# Patient Record
Sex: Male | Born: 1955 | Race: White | Hispanic: No | Smoking: Current every day smoker
Health system: Southern US, Community
[De-identification: ages and names within clinical notes are randomized; demographics above are authoritative.]

## PROBLEM LIST (undated history)

## (undated) DIAGNOSIS — E119 Type 2 diabetes mellitus without complications: Secondary | ICD-10-CM

## (undated) DIAGNOSIS — K769 Liver disease, unspecified: Secondary | ICD-10-CM

## (undated) DIAGNOSIS — B1920 Unspecified viral hepatitis C without hepatic coma: Secondary | ICD-10-CM

## (undated) DIAGNOSIS — D496 Neoplasm of unspecified behavior of brain: Secondary | ICD-10-CM

## (undated) HISTORY — DX: Type 2 diabetes mellitus without complications: E11.9

## (undated) HISTORY — DX: Neoplasm of unspecified behavior of brain: D49.6

## (undated) HISTORY — DX: Unspecified viral hepatitis C without hepatic coma: B19.20

---

## 2014-09-28 ENCOUNTER — Other Ambulatory Visit: Payer: Self-pay

## 2014-09-28 ENCOUNTER — Emergency Department: Payer: Self-pay

## 2014-09-28 ENCOUNTER — Emergency Department
Admission: EM | Admit: 2014-09-28 | Discharge: 2014-09-28 | Disposition: A | Discharge status: Home / Self Care | Payer: Self-pay | Attending: Emergency Medicine | Admitting: Emergency Medicine

## 2014-09-28 DIAGNOSIS — E119 Type 2 diabetes mellitus without complications: Secondary | ICD-10-CM | POA: Insufficient documentation

## 2014-09-28 DIAGNOSIS — F17210 Nicotine dependence, cigarettes, uncomplicated: Secondary | ICD-10-CM | POA: Insufficient documentation

## 2014-09-28 DIAGNOSIS — R51 Headache: Secondary | ICD-10-CM

## 2014-09-28 DIAGNOSIS — G8929 Other chronic pain: Secondary | ICD-10-CM

## 2014-09-28 DIAGNOSIS — J329 Chronic sinusitis, unspecified: Secondary | ICD-10-CM | POA: Insufficient documentation

## 2014-09-28 DIAGNOSIS — R55 Syncope and collapse: Secondary | ICD-10-CM | POA: Insufficient documentation

## 2014-09-28 LAB — COMPREHENSIVE METABOLIC PANEL
ALT: 21 U/L (ref 0–55)
AST (SGOT): 26 U/L (ref 5–34)
Albumin/Globulin Ratio: 1 (ref 0.9–2.2)
Albumin: 3.6 g/dL (ref 3.5–5.0)
Alkaline Phosphatase: 53 U/L (ref 38–106)
BUN: 22 mg/dL (ref 9.0–28.0)
Bilirubin, Total: 0.9 mg/dL (ref 0.2–1.2)
CO2: 21 mEq/L — ABNORMAL LOW (ref 22–29)
Calcium: 8.5 mg/dL (ref 8.5–10.5)
Chloride: 102 mEq/L (ref 100–111)
Creatinine: 1.1 mg/dL (ref 0.7–1.3)
Globulin: 3.6 g/dL (ref 2.0–3.6)
Glucose: 178 mg/dL — ABNORMAL HIGH (ref 70–100)
Potassium: 4.6 mEq/L (ref 3.5–5.1)
Protein, Total: 7.2 g/dL (ref 6.0–8.3)
Sodium: 132 mEq/L — ABNORMAL LOW (ref 136–145)

## 2014-09-28 LAB — CBC AND DIFFERENTIAL
Basophils Absolute Automated: 0.03 10*3/uL (ref 0.00–0.20)
Basophils Automated: 0 %
Eosinophils Absolute Automated: 0.06 10*3/uL (ref 0.00–0.70)
Eosinophils Automated: 1 %
Hematocrit: 40.8 % — ABNORMAL LOW (ref 42.0–52.0)
Hgb: 14.7 g/dL (ref 13.0–17.0)
Immature Granulocytes Absolute: 0.02 10*3/uL
Immature Granulocytes: 0 %
Lymphocytes Absolute Automated: 2 10*3/uL (ref 0.50–4.40)
Lymphocytes Automated: 28 %
MCH: 30.8 pg (ref 28.0–32.0)
MCHC: 36 g/dL (ref 32.0–36.0)
MCV: 85.4 fL (ref 80.0–100.0)
MPV: 10.2 fL (ref 9.4–12.3)
Monocytes Absolute Automated: 0.63 10*3/uL (ref 0.00–1.20)
Monocytes: 9 %
Neutrophils Absolute: 4.49 10*3/uL (ref 1.80–8.10)
Neutrophils: 62 %
Nucleated RBC: 0 /100 WBC (ref 0–1)
Platelets: 153 10*3/uL (ref 140–400)
RBC: 4.78 10*6/uL (ref 4.70–6.00)
RDW: 13 % (ref 12–15)
WBC: 7.23 10*3/uL (ref 3.50–10.80)

## 2014-09-28 LAB — GFR: EGFR: >60

## 2014-09-28 MED ORDER — KETOROLAC TROMETHAMINE 30 MG/ML IJ SOLN
30.0000 mg | Freq: Once | INTRAMUSCULAR | Status: AC
Start: 2014-09-28 — End: 2014-09-28
  Administered 2014-09-28: 30 mg via INTRAVENOUS
  Filled 2014-09-28: qty 1

## 2014-09-28 MED ORDER — GABAPENTIN 400 MG PO CAPS
800.0000 mg | ORAL_CAPSULE | Freq: Every day | ORAL | 0 refills | Status: AC
Start: 2014-09-28 — End: 2014-10-28
  Filled 2014-09-28: qty 60, 30d supply, fill #0

## 2014-09-28 MED ORDER — IBUPROFEN 600 MG PO TABS
600.0000 mg | ORAL_TABLET | Freq: Four times a day (QID) | ORAL | 0 refills | Status: AC | PRN
Start: 2014-09-28 — End: ?
  Filled 2014-09-28: qty 30, 8d supply, fill #0

## 2014-09-28 MED ORDER — SODIUM CHLORIDE 0.9 % IV BOLUS
1000.0000 mL | Freq: Once | INTRAVENOUS | Status: AC
Start: 2014-09-28 — End: 2014-09-28
  Administered 2014-09-28: 1000 mL via INTRAVENOUS

## 2014-09-28 NOTE — Discharge Instructions (Signed)
Please followup with your Primary Care doctor--he is aware you were here and is expecting you to come  Please take ibuprofen for your headaches and drink plenty of fluid  Return with any concerns    Headache    You have been treated for a headache.    Headaches are very common. Most of the time they are benign (not harmful). Some headaches can be very serious. Your headache appears to be benign. The doctor feels it is OK for you to go home.    If you continue to have headaches, or if this headache does not resolve over the next few days, you should be evaluated by your regular doctor or a neurologist. Keep a "headache diary." This may help your doctor learn the cause of your headaches.    Take your headache medication as directed. This is especially important if your doctor has placed you on a daily medication to prevent headaches.    YOU SHOULD SEEK MEDICAL ATTENTION IMMEDIATELY, EITHER HERE OR AT THE NEAREST EMERGENCY DEPARTMENT, IF ANY OF THE FOLLOWING OCCURS:   Your headache gets worse.   You have a severe headache that occurs suddenly.   Your head pain is different from your normal headache.   You have a fever (temperature higher than 100.32F / 38C), especially with a stiff neck.   You feel numbness, tingling, or weakness in your arms or legs.   You pass out.   You have problems with your vision.   You vomit and have trouble taking medication or keeping it down.

## 2014-09-28 NOTE — ED Provider Notes (Signed)
[facility] EMERGENCY DEPARTMENT H&P                                             ATTENDING NOTE         CLINICAL SUMMARY          Diagnosis:    .     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 Pt well appr here. CT head negative. EKG and labs wnl. 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 Agrees with no admission at this time.      Disposition:         Discharge               Discharge Prescriptions     Medication Sig Dispense Auth. Provider    gabapentin (NEURONTIN) 400 MG capsule Take 2 capsules (800 mg total) by mouth daily. 60 capsule Sherwood Gambler, MD    ibuprofen (ADVIL,MOTRIN) 600 MG tablet Take 1 tablet (600 mg total) by mouth every 6 (six) hours as needed for Pain or Fever. 30 tablet Sherwood Gambler, MD                    CLINICAL INFORMATION        HPI:      Chief Complaint: Headache and Dizziness  .    George Dennis is a 59 y.o. male with PMH hep C and DM reporting to ED for HA x 3 days associated with multiple episodes of vasovagal syncope. Syncopal episodes occur mainly when bending over (bending over in shower, reaching for objects in low shelves, reaching for dropped objects).    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 Was started on treatment plan for vertigo but pt has not seen his PMD for over a year.    Pt endorses temporary relief from Ultram. Reports to ED for three days of worsening HA despite taking Ultram. Also endorses weakness in b/l hands.    History obtained  from: patient, pt's PMD      ROS:      Positive and negative ROS elements as per HPI.  All other systems reviewed and negative.      Physical Exam:      Pulse (!) 118  BP 112/68 mmHg  Resp 18  SpO2 95 %  Temp 98.1 F (36.7 C)    General: Well appearing, A&Ox3. Comfortable, NAD. Non toxic appearing.  Head: Atraumatic, normocephalic.  Eyes: No conjunctival injection. No discharge.  EOMI.  ENT:  Mucous membranes moist.  OP clear  Neck:Supple. Normal range of motion.   Respiratory/Chest:  No respiratory distress.  Lungs clear  Abd: soft, non tender  Neurological: Grossly symmetric strength and sensation in bilateral arms and legs.   CN II-XII grossly intact.  Speech normal, no facial  asymmetry.  Gait normal  Skin: Warm and dry. No rash.  Psychiatric: Normal affect.  Normal insight.               PAST HISTORY        Primary Care Provider: [name], MD        PMH/PSH:    .     Past Medical History   Diagnosis Date   . Diabetes mellitus    . Brain tumor    . Hepatitis C        He has past surgical history that includes Testicle surgery.      Social/Family History:      He reports that he has been smoking Cigarettes.  He has been smoking about 1.00 pack per day. He does not have any smokeless tobacco history on file. He reports that he drinks alcohol. His drug history is not on file.    History reviewed. No pertinent family history.      Listed Medications on Arrival:    .     Discharge Medication List as of 09/28/2014  1:16 PM      CONTINUE these medications which have NOT CHANGED    Details   gabapentin (NEURONTIN) 800 MG tablet Take 800 mg by mouth 3 (three) times daily., Until Discontinued, Historical Med      meclizine (ANTIVERT) 32 MG tablet Take 32 mg by mouth 3 (three) times daily as needed., Until Discontinued, Historical Med      metFORMIN (GLUCOPHAGE) 500 MG tablet Take 500 mg by mouth 2 (two) times daily with meals., Until Discontinued, Historical Med      sertraline (ZOLOFT) 100 MG tablet Take 100  mg by mouth 3 (three) times daily., Until Discontinued, Historical Med      traMADol (ULTRAM) 50 MG tablet Take 50 mg by mouth every 6 (six) hours as needed for Pain., Until Discontinued, Historical Med            Allergies: He has No Known Allergies.            VISIT INFORMATION        Clinical Course in the ED:      Differential Diagnosis (not completely inclusive): brain mass, cardiac abnormality, syncope, hyogonadism, headache    Consults in ED/phone:      11:18 AM  Page sent to Dr. Tia Alert, pt's PMD    11:45 AM  D/w Dr. Micheline Chapman who reports workup negative. 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 These supplements seemed to work but was unable to keep pt on these chronically.    1:06 PM  Pt re-eval who states very good relief from medication. Aware of results of labs and imaging and agrees to f/u with his PMD ASAP.    Discharge Medications:      Medication List      START taking these medications          ibuprofen 600 MG tablet   Commonly known as:  ADVIL,MOTRIN   Take 1 tablet (600 mg total) by mouth every 6 (six) hours as needed for Pain or Fever.         CHANGE how you take these medications          * gabapentin 800 MG tablet   Commonly known as:  NEURONTIN   What changed:  Another medication with the same name was added. Make sure you understand how and when to take each.       * gabapentin 400 MG capsule  Commonly known as:  NEURONTIN   Take 2 capsules (800 mg total) by mouth daily.   What changed:  You were already taking a medication with the same name, and this prescription was added. Make sure you understand how and when to take each.       * Notice:  This list has 2 medication(s) that are the same as other medications prescribed for you. Read the directions carefully, and ask your doctor or other care provider to review them with you.      ASK your doctor about these medications          meclizine 32 MG tablet   Commonly known as:  ANTIVERT       metFORMIN 500 MG tablet   Commonly known as:   GLUCOPHAGE       sertraline 100 MG tablet   Commonly known as:  ZOLOFT       traMADol 50 MG tablet   Commonly known as:  ULTRAM         Where to Get Your Medications     You need to pick up these prescriptions. We sent them to a specific pharmacy, so go there to get them.           [facility] EM[facility]G [facility]PT   -  gabapentin 400 MG capsule   -  ibuprofen 600 MG tablet    Emergency Department [address]   [place] [address]   Phone:  [phone]   Hours:  SUN-SAT 9A-11P                        Counseling: I have spoken with the patient and discussed today's findings, in addition to providing specific details for the plan of care. Questions are answered and there is agreement with the plan.       Medications Given in the ED:    .     ED Medication Orders     Start Ordered     Status Ordering Provider    09/28/14 1121 09/28/14 1120  ketorolac (TORADOL) injection 30 mg   Once     Route: Intravenous  Ordered Dose: 30 mg     Last MAR action:  Given Vera Wishart L    09/28/14 1121 09/28/14 1120  sodium chloride 0.9 % bolus 1,000 mL   Once     Route: Intravenous  Ordered Dose: 1,000 mL     Last MAR action:  New Bag Rasheda Ledger L            Procedures:      Procedures      Interpretations:      O2 sat-           saturation: 95 %; Oxygen use: room air; Interpretation: Normal    EKG -             interpreted by me: sinus 80, RBBB, no specific T-wave inversions            RESULTS        Lab Results:      Results     Procedure Component Value Units Date/Time    Comprehensive Metabolic Panel (CMP) [[id number]]  (Abnormal) Collected:  09/28/14 1214    Specimen Information:  Blood Updated:  09/28/14 1239     Glucose 178 (H) mg/dL      BUN 21.3 mg/dL      Creatinine 1.1 mg/dL  Sodium 132 (L) mEq/L      Potassium 4.6 mEq/L      Chloride 102 mEq/L      CO2 21 (L) mEq/L      Calcium 8.5 mg/dL      Protein, Total 7.2 g/dL      Albumin 3.6 g/dL      AST (SGOT) 26 U/L      ALT 21 U/L      Alkaline Phosphatase 53 U/L       Bilirubin, Total 0.9 mg/dL      Globulin 3.6 g/dL      Albumin/Globulin Ratio 1.0     GFR [[id number]] Collected:  09/28/14 1214     EGFR >60.0 Updated:  09/28/14 1239    CBC with differential [[id number]]  (Abnormal) Collected:  09/28/14 1138    Specimen Information:  Blood from Blood Updated:  09/28/14 1151     WBC 7.23 x10 3/uL      Hgb 14.7 g/dL      Hematocrit 78.2 (L) %      Platelets 153 x10 3/uL      RBC 4.78 x10 6/uL      MCV 85.4 fL      MCH 30.8 pg      MCHC 36.0 g/dL      RDW 13 %      MPV 10.2 fL      Neutrophils 62 %      Lymphocytes Automated 28 %      Monocytes 9 %      Eosinophils Automated 1 %      Basophils Automated 0 %      Immature Granulocyte 0 %      Nucleated RBC 0 /100 WBC      Neutrophils Absolute 4.49 x10 3/uL      Abs Lymph Automated 2.00 x10 3/uL      Abs Mono Automated 0.63 x10 3/uL      Abs Eos Automated 0.06 x10 3/uL      Absolute Baso Automated 0.03 x10 3/uL      Absolute Immature Granulocyte 0.02 x10 3/uL               Radiology Results:      CT Head without Contrast   Final Result      1. Normal examination of the brain.   2. Mild atherosclerotic calcification.      Gean Maidens, MD    09/28/2014 11:11 AM                 Scribe Attestation:      I was acting as a Neurosurgeon for Sherwood Gambler, MD on Renae Gloss     I am the first provider for this patient and I personally performed the services documented. Belva Agee is scribing for me on Brizzi,Dawn S. This note and the patient instructions accurately reflect work and decisions made by me.  Sherwood Gambler, MD           Sherwood Gambler, MD  09/28/14 [phone]

## 2014-09-28 NOTE — ED Notes (Signed)
MD at bedside. 

## 2014-09-28 NOTE — ED Notes (Signed)
Dr Goodwin at bedside.

## 2014-09-28 NOTE — ED Notes (Signed)
Bed: N 32  Expected date:   Expected time:   Means of arrival:   Comments:

## 2014-09-29 LAB — ECG 12-LEAD
Atrial Rate: 79 {beats}/min
P Axis: 31 degrees
P-R Interval: 128 ms
Q-T Interval: 438 ms
QRS Duration: 146 ms
QTC Calculation (Bezet): 502 ms
R Axis: 88 degrees
T Axis: 39 degrees
Ventricular Rate: 79 {beats}/min

## 2015-01-28 ENCOUNTER — Emergency Department (HOSPITAL_COMMUNITY): Payer: Self-pay

## 2015-01-28 ENCOUNTER — Encounter (HOSPITAL_COMMUNITY): Payer: Self-pay | Admitting: Emergency Medicine

## 2015-01-28 ENCOUNTER — Emergency Department (HOSPITAL_COMMUNITY): Payer: Medicaid - Out of State

## 2015-01-28 ENCOUNTER — Emergency Department (HOSPITAL_COMMUNITY)
Admission: EM | Admit: 2015-01-28 | Discharge: 2015-01-28 | Disposition: A | Discharge status: Home / Self Care | Payer: Medicaid - Out of State | Attending: Emergency Medicine | Admitting: Emergency Medicine

## 2015-01-28 DIAGNOSIS — Y998 Other external cause status: Secondary | ICD-10-CM | POA: Insufficient documentation

## 2015-01-28 DIAGNOSIS — Z8719 Personal history of other diseases of the digestive system: Secondary | ICD-10-CM | POA: Insufficient documentation

## 2015-01-28 DIAGNOSIS — E119 Type 2 diabetes mellitus without complications: Secondary | ICD-10-CM | POA: Insufficient documentation

## 2015-01-28 DIAGNOSIS — Y9289 Other specified places as the place of occurrence of the external cause: Secondary | ICD-10-CM | POA: Insufficient documentation

## 2015-01-28 DIAGNOSIS — W182XXA Fall in (into) shower or empty bathtub, initial encounter: Secondary | ICD-10-CM | POA: Insufficient documentation

## 2015-01-28 DIAGNOSIS — Y9389 Activity, other specified: Secondary | ICD-10-CM | POA: Insufficient documentation

## 2015-01-28 DIAGNOSIS — S060X9A Concussion with loss of consciousness of unspecified duration, initial encounter: Secondary | ICD-10-CM | POA: Insufficient documentation

## 2015-01-28 DIAGNOSIS — S43402A Unspecified sprain of left shoulder joint, initial encounter: Secondary | ICD-10-CM | POA: Insufficient documentation

## 2015-01-28 DIAGNOSIS — F17200 Nicotine dependence, unspecified, uncomplicated: Secondary | ICD-10-CM | POA: Insufficient documentation

## 2015-01-28 HISTORY — DX: Liver disease, unspecified: K76.9

## 2015-01-28 HISTORY — DX: Type 2 diabetes mellitus without complications: E11.9

## 2015-01-28 LAB — I-STAT CHEM 8, ED
BUN: 17 mg/dL (ref 6–20)
CHLORIDE: 101 mmol/L (ref 101–111)
Calcium, Ion: 1.1 mmol/L — ABNORMAL LOW (ref 1.12–1.23)
Creatinine, Ser: 0.9 mg/dL (ref 0.61–1.24)
GLUCOSE: 217 mg/dL — AB (ref 65–99)
HCT: 43 % (ref 39.0–52.0)
HEMOGLOBIN: 14.6 g/dL (ref 13.0–17.0)
POTASSIUM: 4.2 mmol/L (ref 3.5–5.1)
SODIUM: 135 mmol/L (ref 135–145)
TCO2: 22 mmol/L (ref 0–100)

## 2015-01-28 MED ORDER — IBUPROFEN 200 MG PO TABS
600.0000 mg | ORAL_TABLET | Freq: Once | ORAL | Status: AC
  Administered 2015-01-28: 600 mg via ORAL
  Filled 2015-01-28: qty 3

## 2015-01-28 MED ORDER — IBUPROFEN 800 MG PO TABS: 800.0000 mg | ORAL_TABLET | Freq: Three times a day (TID) | ORAL | Status: AC | PRN

## 2015-01-28 MED ORDER — ACETAMINOPHEN 500 MG PO TABS: 1000.0000 mg | ORAL_TABLET | Freq: Once | ORAL | Status: DC

## 2015-01-28 NOTE — ED Notes (Signed)
Per pt, states he fell in the shower hitting forehead and back of head 2 days ago-goose egg on forehead-states pain

## 2015-01-28 NOTE — ED Notes (Signed)
Patient transported to Radiology 

## 2015-01-28 NOTE — ED Provider Notes (Addendum)
CSN: [id number]     Arrival date & time 01/28/15  [id number] History   First MD Initiated Contact with Patient 01/28/15 [phone]     Chief Complaint  Patient presents with  . Fall     (Consider location/radiation/quality/duration/timing/severity/associated sxs/prior Treatment) HPI  60 year old male presents with headache, dizziness since a fall 2 days ago. Patient states he was adjusting the water in the shower in the knob came off causing him to fall. When he stood up he hit his head again. Patient states since then he has passed out twice. Both times he was urinating while standing up. If he urinates while sitting down he does not pass out. Any type of quick movement, especially standing up quickly causing him to get dizzy since the head injury. No vomiting. No focal weakness or numbness. Also injured his left shoulder during the fall. Patient is not dizzy while sitting on the stretcher currently. Denies any chest pain or shortness of breath. Has not taken anything for the pain.  Past Medical History  Diagnosis Date  . Diabetes mellitus without complication (HCC)   . Liver disease    History reviewed. No pertinent past surgical history. No family history on file. Social History  Substance Use Topics  . Smoking status: Current Every Day Smoker  . Smokeless tobacco: None  . Alcohol Use: No    Review of Systems  Respiratory: Negative for shortness of breath.   Cardiovascular: Negative for chest pain.  Gastrointestinal: Negative for vomiting.  Neurological: Positive for dizziness, tremors (chronic per him) and headaches. Negative for weakness.  All other systems reviewed and are negative.     Allergies  Review of patient's allergies indicates not on file.  Home Medications   Prior to Admission medications   Not on File   BP 131/98 mmHg  Pulse 113  Temp(Src) 98.2 F (36.8 C) (Oral)  Resp 18  SpO2 97% Physical Exam  Constitutional: He is oriented to person, place, and time. He  appears well-developed and well-nourished.  HENT:  Head: Normocephalic.    Right Ear: External ear normal.  Left Ear: External ear normal.  Nose: Nose normal.  Eyes: Right eye exhibits no discharge. Left eye exhibits no discharge.  Neck: Neck supple.  Cardiovascular: Normal rate, regular rhythm, normal heart sounds and intact distal pulses.   Pulmonary/Chest: Effort normal and breath sounds normal.  Abdominal: Soft. There is no tenderness.  Musculoskeletal: He exhibits no edema.       Left shoulder: He exhibits tenderness (especially with ROM).  Neurological: He is alert and oriented to person, place, and time. He displays tremor.  CN 2-12 grossly intact. 5/5 strength in all 4 extremities. Grossly normal sensation. Normal finger to nose. Mild tremor with finger to nose but hits finger and nose without difficulty  Skin: Skin is warm and dry.  Nursing note and vitals reviewed.   ED Course  Procedures (including critical care time) Labs Review Labs Reviewed  I-STAT CHEM 8, ED - Abnormal; Notable for the following:    Glucose, Bld 217 (*)    Calcium, Ion 1.10 (*)    All other components within normal limits    Imaging Review Ct Head Wo Contrast  01/28/2015  CLINICAL DATA:  Per pt, he was dizzy and states he fell in the shower hitting forehead and back of head 2 days ago-goose egg on forehead-states pain EXAM: CT HEAD WITHOUT CONTRAST TECHNIQUE: Contiguous axial images were obtained from the base of the skull through the vertex  without intravenous contrast. COMPARISON:  None. FINDINGS: There is no intra or extra-axial fluid collection or mass lesion. The basilar cisterns and ventricles have a normal appearance. There is no CT evidence for acute infarction or hemorrhage. 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 Bone windows show left frontal scalp edema/hematoma. No underlying calvarial fracture. IMPRESSION: 1.  No evidence for acute intracranial abnormality. 2. Left frontal scalp  edema. Electronically Signed   By: Norva Pavlov M.D.   On: 01/28/2015 09:52   Dg Shoulder Left  01/28/2015  CLINICAL DATA:  Fall in shower yesterday hitting head and tried to catch self with lt arm, generalized lt shoulder pain, good range of motion, dizzy and lightheaded EXAM: LEFT SHOULDER - 2+ VIEW COMPARISON:  None. FINDINGS: Subacromial narrowing is identified. Small osteophyte along the inferior aspect of the glenoid rim, consistent with chronic rotator cuff injury. No evidence for acute fracture or subluxation. Left lung apex is clear. IMPRESSION: Chronic changes suggesting chronic rotator cuff injury. No evidence for acute  abnormality. Electronically Signed   By: Norva Pavlov M.D.   On: 01/28/2015 10:31   I have personally reviewed and evaluated these images and lab results as part of my medical decision-making.   EKG Interpretation None      MDM   Final diagnoses:  Concussion, with loss of consciousness of unspecified duration, initial encounter  Shoulder sprain, left, initial encounter    Patient symptoms are consistent with a concussion and left shoulder sprain. He is neurologically intact aside mild tremors. He denies alcohol abuse but he does state he has liver disease. Due to this will avoid Tylenol. He was able to ambulate for me and walks normally. Given initial tachycardia with complaint of dizziness labs were checked but are unremarkable besides mildly hyperglycemia. HR normal on recheck. Will give ibuprofen for pain and patient is requesting 800 mg ibuprofen prescription. Discussed head injury precautions and return precautions.    Pricilla Loveless, MD 01/28/15 1101  Pricilla Loveless, MD 01/28/15 1101

## 2016-11-10 IMAGING — CT CT HEAD W/O CM
2 series · 17 of 30 positions shown, 20 images · non-contrast
Comparison: None.

CLINICAL DATA: Per pt, he was dizzy and states he fell in the
shower hitting forehead and back of head 2 days Eduben Divantoque on
forehead-states pain

EXAM:
CT HEAD WITHOUT CONTRAST
TECHNIQUE: Contiguous axial images were obtained from the base of the skull
through the vertex without intravenous contrast.

[Series 2: head w/o · axial · non-contrast · 0.46mm/px · z∈[-96,+24]mm · 9 of 32 slices shown, 12 images]
[im 4/32  brain]
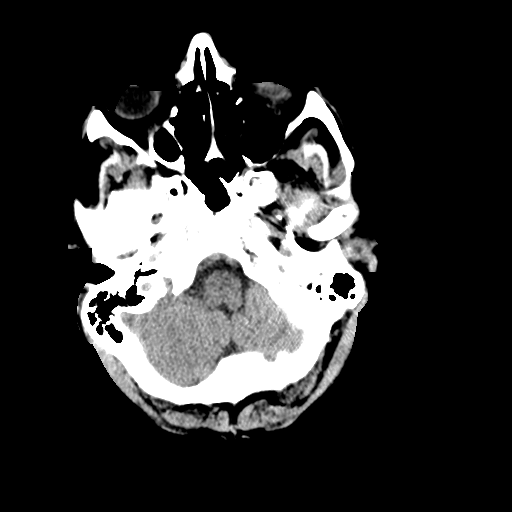
[im 4/32  bone]
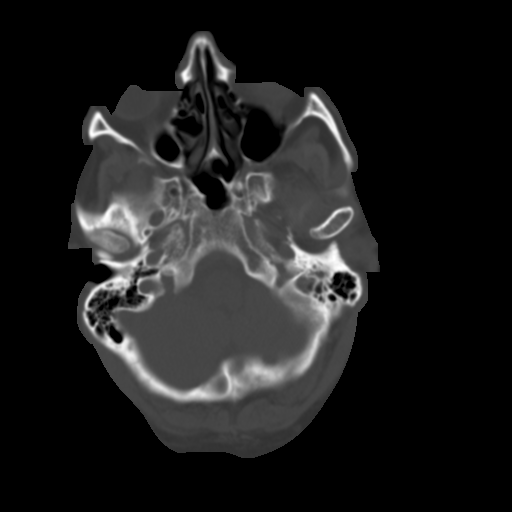
[im 7/32  brain]
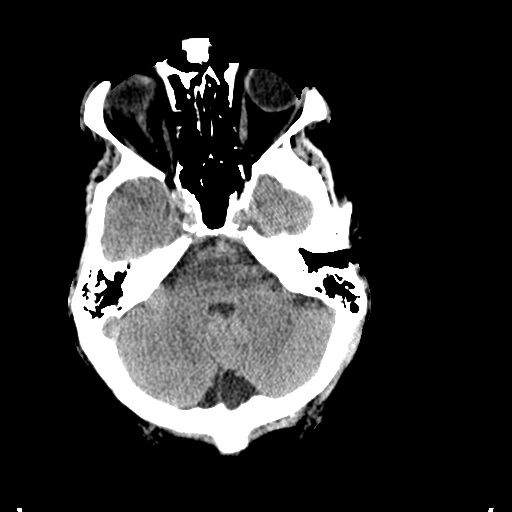
[im 10/32  brain]
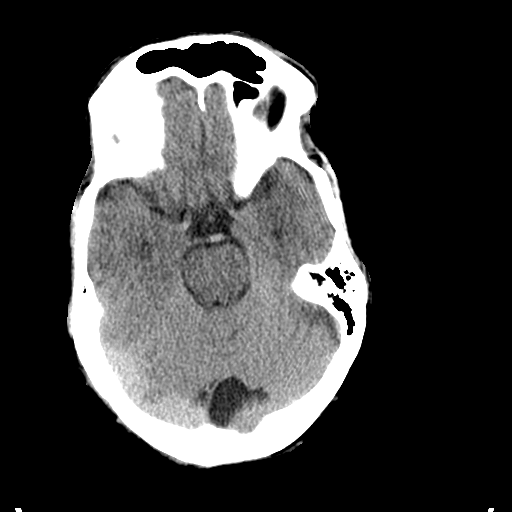
[im 13/32  brain]
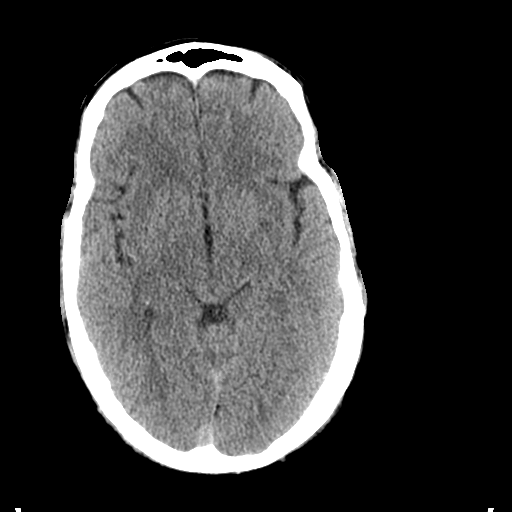
[im 16/32  brain]
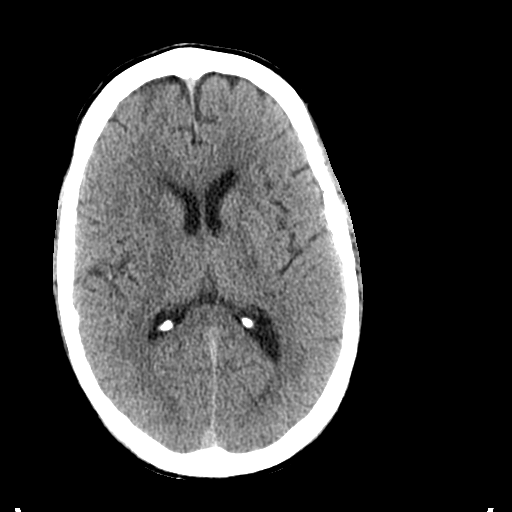
[im 16/32  bone]
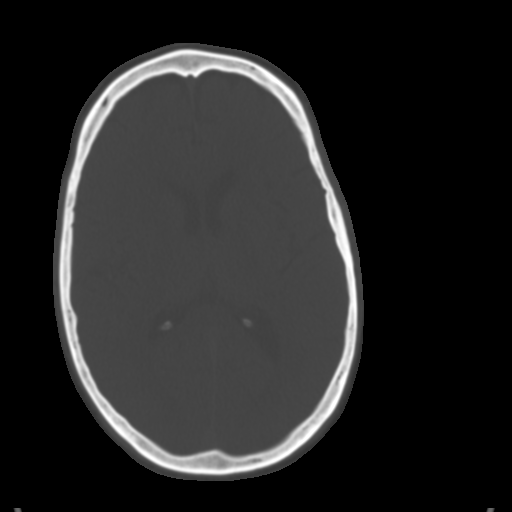
[im 19/32  brain]
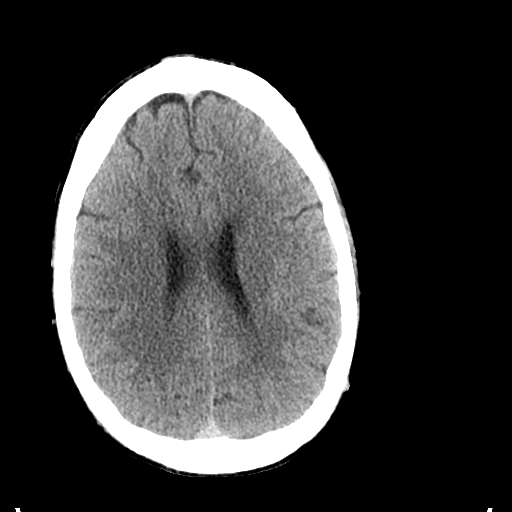
[im 22/32  brain]
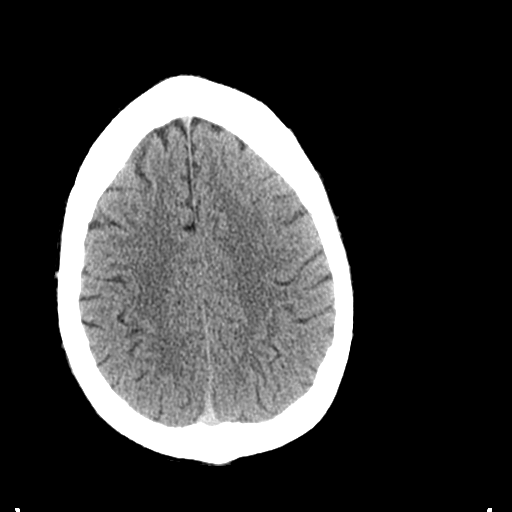
[im 25/32  brain]
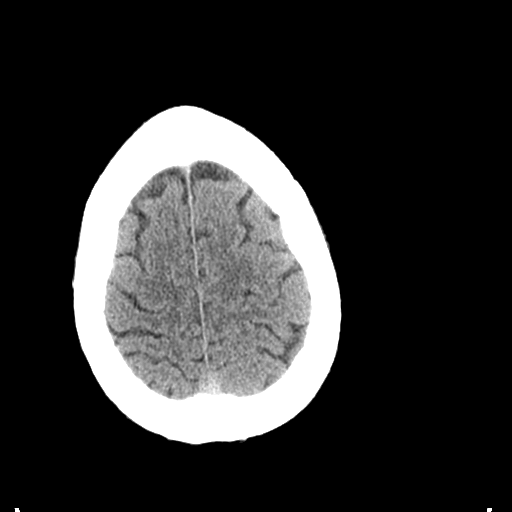
[im 28/32  brain]
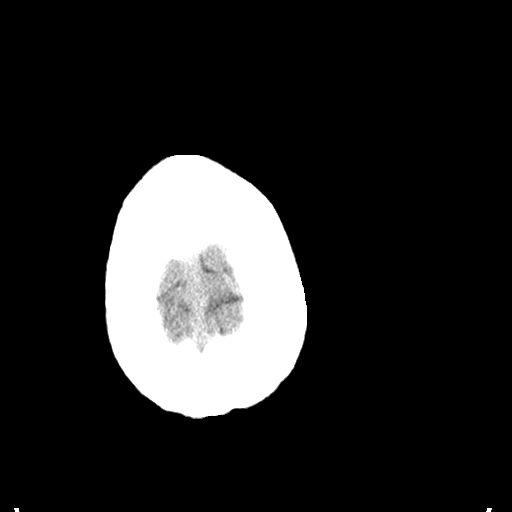
[im 28/32  bone]
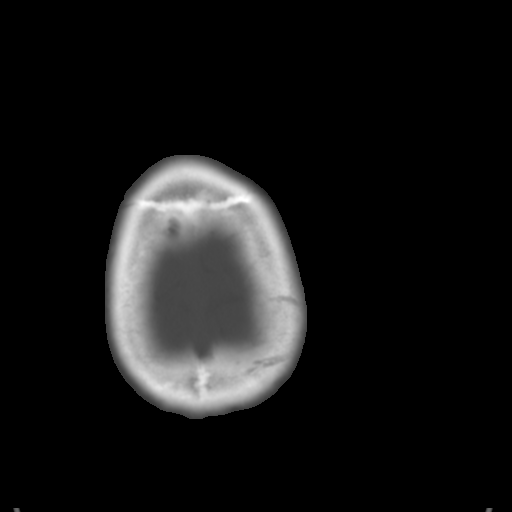

[Series 3: bone windows · axial · 0.46mm/px · z∈[-94,+29]mm · 8 of 53 slices shown]
[im 6/53  bone]
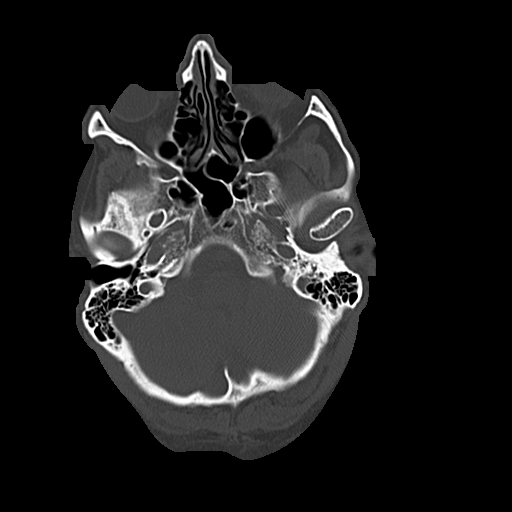
[im 12/53  bone]
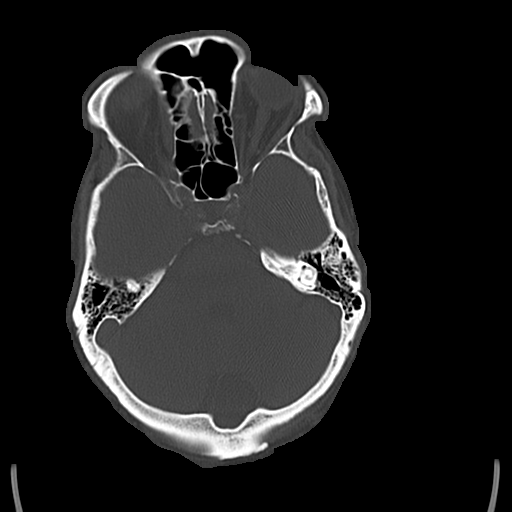
[im 18/53  bone]
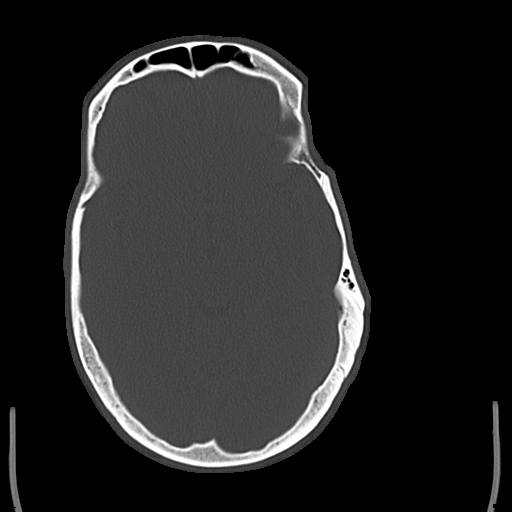
[im 24/53  bone]
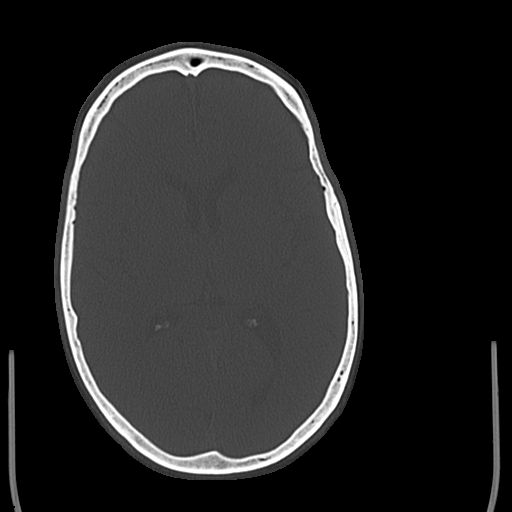
[im 29/53  bone]
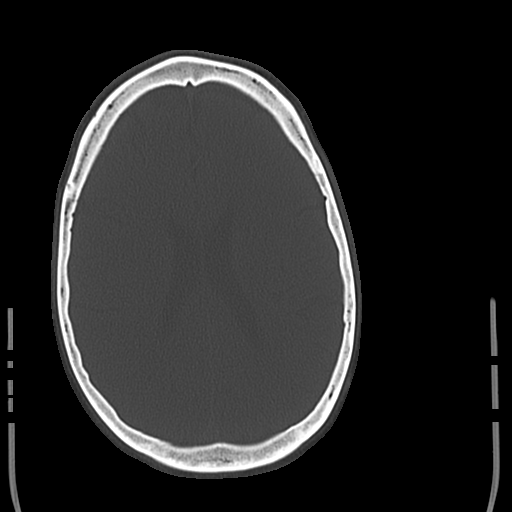
[im 35/53  bone]
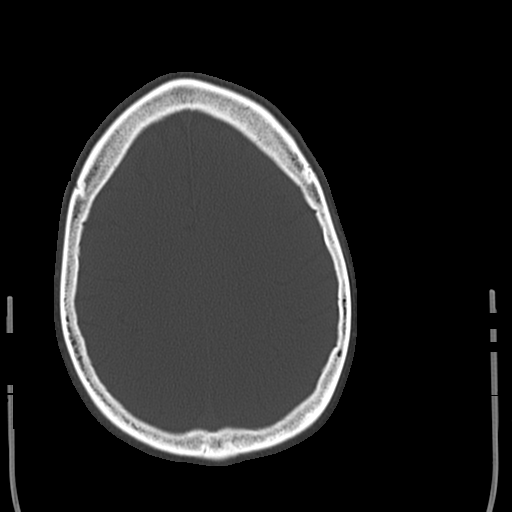
[im 41/53  bone]
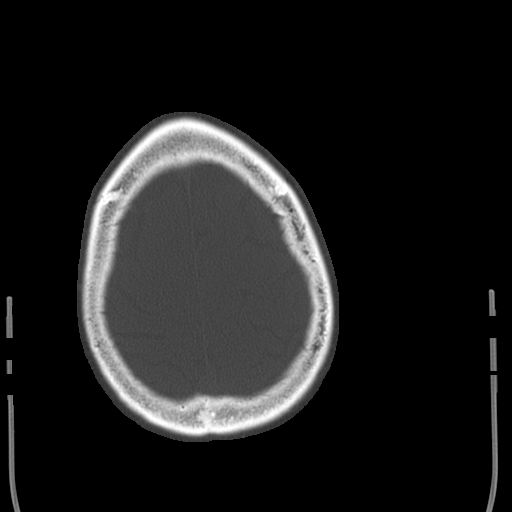
[im 47/53  bone]
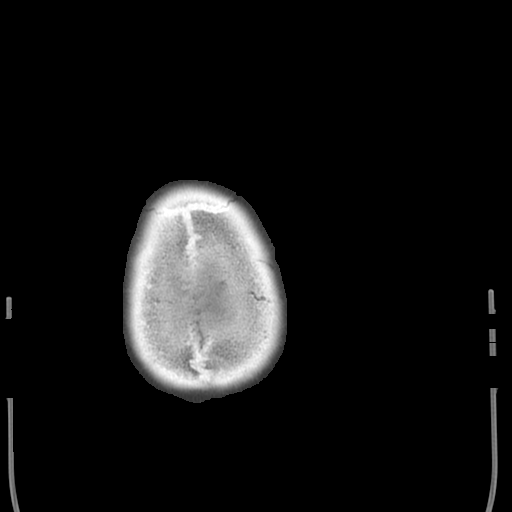

[17 of 30 positions shown; findings below may reference images not displayed]

FINDINGS: There is no intra or extra-axial fluid collection or mass lesion.
The basilar cisterns and ventricles have a normal appearance. There
is no CT evidence for acute infarction or hemorrhage. Incidental
note is made of mega cisterna magna. Bone windows show left frontal
scalp edema/hematoma. No underlying calvarial fracture.
IMPRESSION: 1.  No evidence for acute intracranial abnormality.
2. Left frontal scalp edema.
